# Patient Record
Sex: Male | Born: 1990 | Race: Black or African American | Hispanic: No | Marital: Single | Smoking: Former smoker
Health system: Southern US, Community
[De-identification: ages and names within clinical notes are randomized; demographics above are authoritative.]

---

## 2015-02-09 ENCOUNTER — Emergency Department (HOSPITAL_COMMUNITY)
Admission: EM | Admit: 2015-02-09 | Discharge: 2015-02-09 | Disposition: A | Discharge status: Home / Self Care | Payer: BLUE CROSS/BLUE SHIELD | Attending: Emergency Medicine | Admitting: Emergency Medicine

## 2015-02-09 ENCOUNTER — Encounter (HOSPITAL_COMMUNITY): Payer: Self-pay | Admitting: Nurse Practitioner

## 2015-02-09 DIAGNOSIS — J029 Acute pharyngitis, unspecified: Secondary | ICD-10-CM | POA: Diagnosis not present

## 2015-02-09 DIAGNOSIS — Z720 Tobacco use: Secondary | ICD-10-CM | POA: Diagnosis not present

## 2015-02-09 LAB — RAPID HIV SCREEN (HIV 1/2 AB+AG)
HIV 1/2 ANTIBODIES: NONREACTIVE
HIV-1 P24 Antigen - HIV24: NONREACTIVE

## 2015-02-09 LAB — CBG MONITORING, ED: Glucose-Capillary: 74 mg/dL (ref 70–99)

## 2015-02-09 LAB — RAPID STREP SCREEN (MED CTR MEBANE ONLY): STREPTOCOCCUS, GROUP A SCREEN (DIRECT): NEGATIVE

## 2015-02-09 MED ORDER — CETIRIZINE-PSEUDOEPHEDRINE ER 5-120 MG PO TB12: 1.0000 | ORAL_TABLET | Freq: Every day | ORAL | Status: AC

## 2015-02-09 NOTE — ED Provider Notes (Signed)
CSN: [id number]     Arrival date & time 02/09/15  1555 History  This chart was scribed for non-physician practitioner, Jaynie Crumble, PA-C working with Samuel Jester, DO by Greggory Stallion, ED scribe. This patient was seen in room TR10C/TR10C and the patient's care was started at 4:57 PM.   Chief Complaint  Patient presents with  . Sore Throat   The history is provided by the patient. No language interpreter was used.    HPI Comments: Nathan Daniel is a 24 y.o. male who presents to the Emergency Department complaining of sore throat that started 3 weeks ago. Swallowing worsens pain. Pt reports cold symptoms when the sore throat first started but states they have resolved. He has used Hall's cough drops with little relief. Pt denies fever, rhinorrhea, sneezing, itchy eyes. He smokes cigarettes daily.   History reviewed. No pertinent past medical history. History reviewed. No pertinent past surgical history. History reviewed. No pertinent family history. History  Substance Use Topics  . Smoking status: Current Every Day Smoker    Types: Cigarettes  . Smokeless tobacco: Not on file  . Alcohol Use: Yes    Review of Systems  Constitutional: Negative for fever.  HENT: Positive for sore throat. Negative for rhinorrhea and sneezing.   Eyes: Negative for itching.  All other systems reviewed and are negative.  Allergies  Review of patient's allergies indicates no known allergies.  Home Medications   Prior to Admission medications   Not on File   BP 143/74 mmHg  Pulse 88  Temp(Src) 99.2 F (37.3 C) (Oral)  Resp 20  SpO2 98%   Physical Exam  Constitutional: He is oriented to person, place, and time. He appears well-developed and well-nourished. No distress.  HENT:  Head: Normocephalic and atraumatic.  Right Ear: Tympanic membrane and ear canal normal.  Left Ear: Tympanic membrane and ear canal normal.  Oropharynx erythematous, tonsils are normal size with no enlargement.  Uvula is midline. Tongue with mild white covering.  Eyes: Conjunctivae and EOM are normal.  Neck: Neck supple. No tracheal deviation present.  Cardiovascular: Normal rate.   Pulmonary/Chest: Effort normal. No respiratory distress.  Musculoskeletal: Normal range of motion.  Neurological: He is alert and oriented to person, place, and time.  Skin: Skin is warm and dry.  Psychiatric: He has a normal mood and affect. His behavior is normal.  Nursing note and vitals reviewed.   ED Course  Procedures (including critical care time)  DIAGNOSTIC STUDIES: Oxygen Saturation is 98% on RA, normal by my interpretation.    COORDINATION OF CARE: 5:01 PM-Advised pt of strep results. Discussed treatment plan which includes an allergy medication, ibuprofen and salt water gargles with pt at bedside and pt agreed to plan. Will give pt a referral to [facility] and advised him to follow up.   Labs Review Labs Reviewed  RAPID STREP SCREEN  CULTURE, GROUP A STREP  RAPID HIV SCREEN (HIV 1/2 AB+AG)  CBG MONITORING, ED    Imaging Review No results found.   EKG Interpretation None      MDM   Final diagnoses:  Pharyngitis   Patient with sore throat for several weeks, white coating over the tongue. I do not think this is thrush however patient is concerned. Will get CBG and HIV test. Strep negative. Most likely viral or allergic pharyngitis.    7:15 PM Rapid screen HIV negative. CBG normal. Most likely allergic pharyngitis from post nasal drainage. Will start on sudafed and zyrtec.  Follow up with pcp if not improving.   Filed Vitals:   02/09/15 1558 02/09/15 1907  BP: 143/74 124/71  Pulse: 88 91  Temp: 99.2 F (37.3 C) 99.4 F (37.4 C)  TempSrc: Oral Oral  Resp: 20 14  SpO2: 98% 100%   I personally performed the services described in this documentation, which was scribed in my presence. The recorded information has been reviewed and is accurate.   Jaynie Crumbleatyana Haik Mahoney,  PA-C 02/09/15 1916  Samuel JesterKathleen McManus, DO 02/10/15 0126

## 2015-02-09 NOTE — Discharge Instructions (Signed)
Salt water gargle. Take zyrtec-D twice a day. Please follow up with wellness center if not improving.    Pharyngitis Pharyngitis is redness, pain, and swelling (inflammation) of your pharynx.  CAUSES  Pharyngitis is usually caused by infection. Most of the time, these infections are from viruses (viral) and are part of a cold. However, sometimes pharyngitis is caused by bacteria (bacterial). Pharyngitis can also be caused by allergies. Viral pharyngitis may be spread from person to person by coughing, sneezing, and personal items or utensils (cups, forks, spoons, toothbrushes). Bacterial pharyngitis may be spread from person to person by more intimate contact, such as kissing.  SIGNS AND SYMPTOMS  Symptoms of pharyngitis include:   Sore throat.   Tiredness (fatigue).   Low-grade fever.   Headache.  Joint pain and muscle aches.  Skin rashes.  Swollen lymph nodes.  Plaque-like film on throat or tonsils (often seen with bacterial pharyngitis). DIAGNOSIS  Your health care provider will ask you questions about your illness and your symptoms. Your medical history, along with a physical exam, is often all that is needed to diagnose pharyngitis. Sometimes, a rapid strep test is done. Other lab tests may also be done, depending on the suspected cause.  TREATMENT  Viral pharyngitis will usually get better in 3-4 days without the use of medicine. Bacterial pharyngitis is treated with medicines that kill germs (antibiotics).  HOME CARE INSTRUCTIONS   Drink enough water and fluids to keep your urine clear or pale yellow.   Only take over-the-counter or prescription medicines as directed by your health care provider:   If you are prescribed antibiotics, make sure you finish them even if you start to feel better.   Do not take aspirin.   Get lots of rest.   Gargle with 8 oz of salt water ( tsp of salt per 1 qt of water) as often as every 1-2 hours to soothe your throat.    Throat lozenges (if you are not at risk for choking) or sprays may be used to soothe your throat. SEEK MEDICAL CARE IF:   You have large, tender lumps in your neck.  You have a rash.  You cough up green, yellow-brown, or bloody spit. SEEK IMMEDIATE MEDICAL CARE IF:   Your neck becomes stiff.  You drool or are unable to swallow liquids.  You vomit or are unable to keep medicines or liquids down.  You have severe pain that does not go away with the use of recommended medicines.  You have trouble breathing (not caused by a stuffy nose). MAKE SURE YOU:   Understand these instructions.  Will watch your condition.  Will get help right away if you are not doing well or get worse. Document Released: 11/01/2005 Document Revised: 08/22/2013 Document Reviewed: 07/09/2013 [facility] Patient Information 2015 [place], [place]. This information is not intended to replace advice given to you by your health care provider. Make sure you discuss any questions you have with your health care provider.

## 2015-02-09 NOTE — ED Notes (Signed)
hes had a sore throat "for about 3 weeks since the weather changed." he denies fevers, congestion, ear pain. He states it hurts to swallow

## 2015-02-11 ENCOUNTER — Emergency Department (HOSPITAL_COMMUNITY): Payer: BLUE CROSS/BLUE SHIELD

## 2015-02-11 ENCOUNTER — Emergency Department (HOSPITAL_COMMUNITY)
Admission: EM | Admit: 2015-02-11 | Discharge: 2015-02-12 | Disposition: A | Discharge status: Home / Self Care | Payer: BLUE CROSS/BLUE SHIELD | Attending: Emergency Medicine | Admitting: Emergency Medicine

## 2015-02-11 ENCOUNTER — Encounter (HOSPITAL_COMMUNITY): Payer: Self-pay | Admitting: *Deleted

## 2015-02-11 DIAGNOSIS — Z79899 Other long term (current) drug therapy: Secondary | ICD-10-CM | POA: Insufficient documentation

## 2015-02-11 DIAGNOSIS — M791 Myalgia, unspecified site: Secondary | ICD-10-CM

## 2015-02-11 DIAGNOSIS — R079 Chest pain, unspecified: Secondary | ICD-10-CM | POA: Insufficient documentation

## 2015-02-11 DIAGNOSIS — R059 Cough, unspecified: Secondary | ICD-10-CM

## 2015-02-11 DIAGNOSIS — R110 Nausea: Secondary | ICD-10-CM | POA: Insufficient documentation

## 2015-02-11 DIAGNOSIS — Z720 Tobacco use: Secondary | ICD-10-CM | POA: Diagnosis not present

## 2015-02-11 DIAGNOSIS — R05 Cough: Secondary | ICD-10-CM

## 2015-02-11 DIAGNOSIS — J029 Acute pharyngitis, unspecified: Secondary | ICD-10-CM | POA: Insufficient documentation

## 2015-02-11 DIAGNOSIS — R0789 Other chest pain: Secondary | ICD-10-CM

## 2015-02-11 LAB — I-STAT CHEM 8, ED
BUN: 6 mg/dL (ref 6–23)
CREATININE: 0.9 mg/dL (ref 0.50–1.35)
Calcium, Ion: 1.09 mmol/L — ABNORMAL LOW (ref 1.12–1.23)
Chloride: 97 mmol/L (ref 96–112)
GLUCOSE: 90 mg/dL (ref 70–99)
HCT: 49 % (ref 39.0–52.0)
Hemoglobin: 16.7 g/dL (ref 13.0–17.0)
POTASSIUM: 3.1 mmol/L — AB (ref 3.5–5.1)
SODIUM: 135 mmol/L (ref 135–145)
TCO2: 22 mmol/L (ref 0–100)

## 2015-02-11 LAB — CBC WITH DIFFERENTIAL/PLATELET
Basophils Absolute: 0 10*3/uL (ref 0.0–0.1)
Basophils Relative: 0 % (ref 0–1)
EOS ABS: 0 10*3/uL (ref 0.0–0.7)
Eosinophils Relative: 0 % (ref 0–5)
HEMATOCRIT: 44.7 % (ref 39.0–52.0)
Hemoglobin: 15.4 g/dL (ref 13.0–17.0)
Lymphocytes Relative: 15 % (ref 12–46)
Lymphs Abs: 1.7 10*3/uL (ref 0.7–4.0)
MCH: 29.8 pg (ref 26.0–34.0)
MCHC: 34.5 g/dL (ref 30.0–36.0)
MCV: 86.6 fL (ref 78.0–100.0)
Monocytes Absolute: 1.3 10*3/uL — ABNORMAL HIGH (ref 0.1–1.0)
Monocytes Relative: 11 % (ref 3–12)
Neutro Abs: 8.4 10*3/uL — ABNORMAL HIGH (ref 1.7–7.7)
Neutrophils Relative %: 74 % (ref 43–77)
Platelets: 210 10*3/uL (ref 150–400)
RBC: 5.16 MIL/uL (ref 4.22–5.81)
RDW: 13.4 % (ref 11.5–15.5)
WBC: 11.4 10*3/uL — ABNORMAL HIGH (ref 4.0–10.5)

## 2015-02-11 MED ORDER — DEXAMETHASONE 4 MG PO TABS
6.0000 mg | ORAL_TABLET | Freq: Once | ORAL | Status: AC
  Administered 2015-02-11: 6 mg via ORAL
  Filled 2015-02-11: qty 2

## 2015-02-11 MED ORDER — SODIUM CHLORIDE 0.9 % IV BOLUS (SEPSIS)
1000.0000 mL | Freq: Once | INTRAVENOUS | Status: AC
Start: 2015-02-11 — End: 2015-02-11
  Administered 2015-02-11: 1000 mL via INTRAVENOUS

## 2015-02-11 MED ORDER — ONDANSETRON HCL 4 MG/2ML IJ SOLN
4.0000 mg | Freq: Once | INTRAMUSCULAR | Status: AC
  Administered 2015-02-11: 4 mg via INTRAVENOUS
  Filled 2015-02-11: qty 2

## 2015-02-11 MED ORDER — ACETAMINOPHEN 325 MG PO TABS
650.0000 mg | ORAL_TABLET | Freq: Once | ORAL | Status: AC
  Administered 2015-02-11: 650 mg via ORAL
  Filled 2015-02-11: qty 2

## 2015-02-11 MED ORDER — GI COCKTAIL ~~LOC~~
30.0000 mL | Freq: Once | ORAL | Status: AC
  Administered 2015-02-11: 30 mL via ORAL
  Filled 2015-02-11: qty 30

## 2015-02-11 NOTE — ED Notes (Signed)
The pt ius c/o chest pain mid since this am.  He was seen here Sunday for a sore throat

## 2015-02-11 NOTE — ED Provider Notes (Signed)
CSN: [id number]     Arrival date & time 02/11/15  2033 History   First MD Initiated Contact with Patient 02/11/15 2117     Chief Complaint  Patient presents with  . Chest Pain     (Consider location/radiation/quality/duration/timing/severity/associated sxs/prior Treatment) HPI Comments: Asian seen 2 days ago for sore throat.  He was tested negative for strep.  Was discharged him.  He continues to have sore throat.  Now he is having difficulty swallowing, nausea, if he eats solids, but is able to take small amounts of liquids frequently.  He is now reporting epigastric pain that radiates to his back that is worse with food attempt.  Denies any diarrhea or vomiting, fever  Has not taken for his symptoms  Patient is a 24 y.o. male presenting with chest pain. The history is provided by the patient.  Chest Pain Pain location:  Epigastric Pain quality: sharp   Pain radiates to:  Upper back Pain radiates to the back: yes   Pain severity:  Mild Onset quality:  Unable to specify Duration:  2 days Timing:  Intermittent Progression:  Worsening Chronicity:  New Context: eating   Relieved by:  Nothing Worsened by:  Nothing tried Associated symptoms: abdominal pain, dysphagia and nausea   Associated symptoms: no cough, no dizziness, no fever, no headache and not vomiting     History reviewed. No pertinent past medical history. History reviewed. No pertinent past surgical history. No family history on file. History  Substance Use Topics  . Smoking status: Current Every Day Smoker    Types: Cigarettes  . Smokeless tobacco: Not on file  . Alcohol Use: Yes    Review of Systems  Constitutional: Negative for fever.  HENT: Positive for sore throat and trouble swallowing.   Respiratory: Negative for cough.   Cardiovascular: Positive for chest pain.  Gastrointestinal: Positive for nausea and abdominal pain. Negative for vomiting and diarrhea.  Genitourinary: Negative for dysuria and flank  pain.  Neurological: Negative for dizziness and headaches.  All other systems reviewed and are negative.     Allergies  Review of patient's allergies indicates no known allergies.  Home Medications   Prior to Admission medications   Medication Sig Start Date End Date Taking? Authorizing Provider  cetirizine-pseudoephedrine (ZYRTEC-D) 5-120 MG per tablet Take 1 tablet by mouth daily. 02/09/15  Yes Tatyana Kirichenko, PA-C   BP 103/58 mmHg  Pulse 87  Temp(Src) 102.8 F (39.3 C)  Resp 16  Ht 6' (1.829 m)  Wt 239 lb (108.41 kg)  BMI 32.41 kg/m2  SpO2 98% Physical Exam  Constitutional: He is oriented to person, place, and time. He appears well-developed and well-nourished.  HENT:  Head: Normocephalic.  Eyes: Pupils are equal, round, and reactive to light.  Neck: Normal range of motion.  Cardiovascular: Normal rate and regular rhythm.   Pulmonary/Chest: Effort normal and breath sounds normal.  Abdominal: Soft. Bowel sounds are normal. He exhibits no distension. There is no tenderness. There is no rebound and no guarding.  Musculoskeletal: Normal range of motion.  Neurological: He is alert and oriented to person, place, and time.  Skin: Skin is warm and dry. No rash noted. No erythema.  Nursing note and vitals reviewed.   ED Course  Procedures (including critical care time) Labs Review Labs Reviewed  CBC WITH DIFFERENTIAL/PLATELET - Abnormal; Notable for the following:    WBC 11.4 (*)    Neutro Abs 8.4 (*)    Monocytes Absolute 1.3 (*)  All other components within normal limits  I-STAT CHEM 8, ED - Abnormal; Notable for the following:    Potassium 3.1 (*)    Calcium, Ion 1.09 (*)    All other components within normal limits    Imaging Review Dg Chest 2 View  02/11/2015   CLINICAL DATA:  Chest pain  EXAM: CHEST  2 VIEW  COMPARISON:  None.  FINDINGS: The heart size and mediastinal contours are within normal limits. Both lungs are clear. The visualized skeletal  structures are unremarkable.  IMPRESSION: No active cardiopulmonary disease.   Electronically Signed   By: Natasha MeadLiviu  Pop M.D.   On: 02/11/2015 21:19     EKG Interpretation   Date/Time:  Tuesday February 11 2015 20:42:02 EDT Ventricular Rate:  101 PR Interval:  156 QRS Duration: 92 QT Interval:  320 QTC Calculation: 414 R Axis:   104 Text Interpretation:  Sinus tachycardia Rightward axis Borderline ECG No  old tracing to compare Confirmed by Ethelda ChickJACUBOWITZ  MD, SAM [phone]([id number]) on  02/11/2015 8:45:39 PM      MDM   Final diagnoses:  Pharyngitis  Chest pain of unknown etiology  Cough  Myalgia         Earley FavorGail Malaka Ruffner, NP 02/12/15 0050  Linwood DibblesJon Knapp, MD 02/12/15 [phone]

## 2015-02-12 LAB — CULTURE, GROUP A STREP: Strep A Culture: NEGATIVE

## 2015-02-12 MED ORDER — KETOROLAC TROMETHAMINE 30 MG/ML IJ SOLN
30.0000 mg | Freq: Once | INTRAMUSCULAR | Status: AC
  Administered 2015-02-12: 30 mg via INTRAVENOUS
  Filled 2015-02-12: qty 1

## 2015-02-12 NOTE — Discharge Instructions (Signed)
Chest Pain (Nonspecific) It is often hard to give a diagnosis for the cause of chest pain. There is always a chance that your pain could be related to something serious, such as a heart attack or a blood clot in the lungs. You need to follow up with your doctor. HOME CARE  If antibiotic medicine was given, take it as directed by your doctor. Finish the medicine even if you start to feel better.  For the next few days, avoid activities that bring on chest pain. Continue physical activities as told by your doctor.  Do not use any tobacco products. This includes cigarettes, chewing tobacco, and e-cigarettes.  Avoid drinking alcohol.  Only take medicine as told by your doctor.  Follow your doctor's suggestions for more testing if your chest pain does not go away.  Keep all doctor visits you made. GET HELP IF:  Your chest pain does not go away, even after treatment.  You have a rash with blisters on your chest.  You have a fever. GET HELP RIGHT AWAY IF:   You have more pain or pain that spreads to your arm, neck, jaw, back, or belly (abdomen).  You have shortness of breath.  You cough more than usual or cough up blood.  You have very bad back or belly pain.  You feel sick to your stomach (nauseous) or throw up (vomit).  You have very bad weakness.  You pass out (faint).  You have chills. This is an emergency. Do not wait to see if the problems will go away. Call your local emergency services (911 in [place] Do not drive yourself to the hospital. MAKE SURE YOU:   Understand these instructions.  Will watch your condition.  Will get help right away if you are not doing well or get worse. Document Released: 04/19/2008 Document Revised: 11/06/2013 Document Reviewed: 04/19/2008 [facility]Exit[facility] Patient Information 2015 [place] ParkExitCare, [place]LL[place]. This information is not intended to replace advice given to you by your health care provider. Make sure you discuss any questions you have with your  health care provider.  Your labwork is within normal parameters.  Your chest x-ray is normal as well as your EKG you have been given IV fluids, pain medication, you were given a long-acting spelled pertinent.  Steroid for the swelling in your throat.  He should not need any further medication for that she can take over-the-counter ibuprofen or Tylenol for fevers and body aches

## 2015-02-12 NOTE — ED Notes (Signed)
Pt. Left with all belongings and refused wheelchair 

## 2015-08-21 ENCOUNTER — Encounter (HOSPITAL_COMMUNITY): Payer: Self-pay | Admitting: Emergency Medicine

## 2015-08-21 ENCOUNTER — Emergency Department (HOSPITAL_COMMUNITY)
Admission: EM | Admit: 2015-08-21 | Discharge: 2015-08-21 | Disposition: A | Discharge status: Home / Self Care | Payer: BLUE CROSS/BLUE SHIELD | Attending: Emergency Medicine | Admitting: Emergency Medicine

## 2015-08-21 DIAGNOSIS — L02211 Cutaneous abscess of abdominal wall: Secondary | ICD-10-CM | POA: Insufficient documentation

## 2015-08-21 DIAGNOSIS — B86 Scabies: Secondary | ICD-10-CM | POA: Insufficient documentation

## 2015-08-21 DIAGNOSIS — L0291 Cutaneous abscess, unspecified: Secondary | ICD-10-CM

## 2015-08-21 DIAGNOSIS — Z87891 Personal history of nicotine dependence: Secondary | ICD-10-CM | POA: Insufficient documentation

## 2015-08-21 DIAGNOSIS — Z79899 Other long term (current) drug therapy: Secondary | ICD-10-CM | POA: Insufficient documentation

## 2015-08-21 MED ORDER — PERMETHRIN 5 % EX CREA: TOPICAL_CREAM | CUTANEOUS | Status: AC

## 2015-08-21 MED ORDER — SULFAMETHOXAZOLE-TRIMETHOPRIM 800-160 MG PO TABS: 1.0000 | ORAL_TABLET | Freq: Two times a day (BID) | ORAL | Status: AC

## 2015-08-21 NOTE — ED Provider Notes (Signed)
CSN: [id number]     Arrival date & time 08/21/15  1118 History   By signing my name below, I, Jarvis Morgan, attest that this documentation has been prepared under the direction and in the presence of No att. providers found. Electronically Signed: Jarvis Morgan, ED Scribe. 08/21/2015. 3:23 PM.    Chief Complaint  Patient presents with  . Insect Bite   The history is provided by the patient. No language interpreter was used.    HPI Comments: Nathan Daniel is a 24 y.o. male who presents to the Emergency Department complaining of itchy, red, rash located in the web space between his fingers onset 3-4 days. Pt also has an associated red, open, dry, raised skin eruption to his abdomen that is itchy. He states 1 of his roommates has similar symptoms. He has not taking any meds PTA. He denies any living or sleeping areas. Pt denies any other complaints at this time.     History reviewed. No pertinent past medical history. History reviewed. No pertinent past surgical history. History reviewed. No pertinent family history. Social History  Substance Use Topics  . Smoking status: Former Smoker    Types: Cigarettes  . Smokeless tobacco: None  . Alcohol Use: Yes    Review of Systems    Allergies  Review of patient's allergies indicates no known allergies.  Home Medications   Prior to Admission medications   Medication Sig Start Date End Date Taking? Authorizing Provider  cetirizine-pseudoephedrine (ZYRTEC-D) 5-120 MG per tablet Take 1 tablet by mouth daily. 02/09/15   Tatyana Kirichenko, PA-C  permethrin (ELIMITE) 5 % cream Apply to affected area once in the evening.  Wash off in the morning. Can reapply in one week. 08/21/15   Donnetta Hutching, MD  sulfamethoxazole-trimethoprim (BACTRIM DS,SEPTRA DS) 800-160 MG tablet Take 1 tablet by mouth 2 (two) times daily. 08/21/15 08/28/15  Donnetta Hutching, MD   Triage Vitals: BP 126/75 mmHg  Pulse 68  Temp(Src) 99.2 F (37.3 C) (Oral)  Resp 16  SpO2  98%  Physical Exam  Constitutional: He is oriented to person, place, and time. He appears well-developed and well-nourished.  HENT:  Head: Normocephalic and atraumatic.  Eyes: Conjunctivae and EOM are normal. Pupils are equal, round, and reactive to light.  Neck: Normal range of motion. Neck supple.  Pulmonary/Chest: Breath sounds normal.  Abdominal: Soft.  Musculoskeletal: Normal range of motion.  Neurological: He is alert and oriented to person, place, and time.  Skin: Skin is warm and dry.  left central lower abdomen area of induration about 2 cm in diameter and central 3mm ulceration Punctate papules in web space between his digits  Psychiatric: He has a normal mood and affect. His behavior is normal.  Nursing note and vitals reviewed.   ED Course  Procedures (including critical care time)  DIAGNOSTIC STUDIES: Oxygen Saturation is 98% on RA, normal by my interpretation.    COORDINATION OF CARE:  12:00 PM- Will order rx for Septra DS along with Permethrin. - Pt advised of plan for treatment and pt agrees.    Labs Review Labs Reviewed - No data to display   Imaging Review No results found.    EKG Interpretation None      MDM   Final diagnoses:  Abscess  Scabies   Patient is a minor abscess on his abdomen and I suspect scabies on his hand. Rx Septra DS and permethrin cream to hands. Discussed diagnoses with patient.  I, Manoah Deckard, personally performed the services described  in this documentation. All medical record entries made by the scribe were at my direction and in my presence.  I have reviewed the chart and discharge instructions and agree that the record reflects my personal performance and is accurate and complete. Ashlyn Cabler.  08/21/2015. 3:23 PM.       Donnetta Hutching, MD 08/21/15 1524

## 2015-08-21 NOTE — ED Notes (Signed)
Pt reports wounds to abdomen 3-4 days. No drainage, denies fever. States has been itching wounds. Also c/o scratching between fingers. Pt awake, alert, oriented x4, NAD at present.

## 2015-08-23 ENCOUNTER — Encounter (HOSPITAL_BASED_OUTPATIENT_CLINIC_OR_DEPARTMENT_OTHER): Payer: Self-pay | Admitting: Emergency Medicine

## 2015-08-23 ENCOUNTER — Emergency Department (HOSPITAL_BASED_OUTPATIENT_CLINIC_OR_DEPARTMENT_OTHER)
Admission: EM | Admit: 2015-08-23 | Discharge: 2015-08-23 | Disposition: A | Discharge status: Home / Self Care | Payer: BLUE CROSS/BLUE SHIELD | Attending: Emergency Medicine | Admitting: Emergency Medicine

## 2015-08-23 DIAGNOSIS — Z4800 Encounter for change or removal of nonsurgical wound dressing: Secondary | ICD-10-CM | POA: Diagnosis present

## 2015-08-23 DIAGNOSIS — L02211 Cutaneous abscess of abdominal wall: Secondary | ICD-10-CM | POA: Diagnosis not present

## 2015-08-23 DIAGNOSIS — L0291 Cutaneous abscess, unspecified: Secondary | ICD-10-CM

## 2015-08-23 DIAGNOSIS — Z79899 Other long term (current) drug therapy: Secondary | ICD-10-CM | POA: Insufficient documentation

## 2015-08-23 DIAGNOSIS — Z87891 Personal history of nicotine dependence: Secondary | ICD-10-CM | POA: Diagnosis not present

## 2015-08-23 NOTE — ED Provider Notes (Signed)
CSN: [id number]     Arrival date & time 08/23/15  2156 History  By signing my name below, I, Budd Palmer, attest that this documentation has been prepared under the direction and in the presence of Rolland Porter, MD. Electronically Signed: Budd Palmer, ED Scribe. 08/23/2015. 10:19 PM.    Chief Complaint  Patient presents with  . Wound Check   The history is provided by the patient. No language interpreter was used.   HPI Comments: Nathan Daniel is a 24 y.o. male who presents to the Emergency Department for a wound check of an abscess to the left central lower abdomen that was treated in the ED 2 days ago. Pt states that an I&D was not performed at that time. He notes he was also given bactrim which he has been taking as prescribed. He reports the abscess has been draining on its own.  History reviewed. No pertinent past medical history. History reviewed. No pertinent past surgical history. History reviewed. No pertinent family history. Social History  Substance Use Topics  . Smoking status: Former Smoker    Types: Cigarettes  . Smokeless tobacco: None  . Alcohol Use: Yes    Review of Systems  Constitutional: Negative for fever, chills, diaphoresis, appetite change and fatigue.  HENT: Negative for mouth sores, sore throat and trouble swallowing.   Eyes: Negative for visual disturbance.  Respiratory: Negative for cough, chest tightness, shortness of breath and wheezing.   Cardiovascular: Negative for chest pain.  Gastrointestinal: Negative for nausea, vomiting, abdominal pain, diarrhea and abdominal distention.  Endocrine: Negative for polydipsia, polyphagia and polyuria.  Genitourinary: Negative for dysuria, frequency and hematuria.  Musculoskeletal: Negative for gait problem.  Skin: Positive for wound. Negative for color change, pallor and rash.  Neurological: Negative for dizziness, syncope, light-headedness and headaches.  Hematological: Does not bruise/bleed easily.   Psychiatric/Behavioral: Negative for behavioral problems and confusion.    Allergies  Review of patient's allergies indicates no known allergies.  Home Medications   Prior to Admission medications   Medication Sig Start Date End Date Taking? Authorizing Provider  cetirizine-pseudoephedrine (ZYRTEC-D) 5-120 MG per tablet Take 1 tablet by mouth daily. 02/09/15   Tatyana Kirichenko, PA-C  permethrin (ELIMITE) 5 % cream Apply to affected area once in the evening.  Wash off in the morning. Can reapply in one week. 08/21/15   Donnetta Hutching, MD  sulfamethoxazole-trimethoprim (BACTRIM DS,SEPTRA DS) 800-160 MG tablet Take 1 tablet by mouth 2 (two) times daily. 08/21/15 08/28/15  Donnetta Hutching, MD   BP 129/68 mmHg  Pulse 60  Temp(Src) 98.4 F (36.9 C) (Oral)  Resp 16  Ht 6' (1.829 m)  Wt 215 lb (97.523 kg)  BMI 29.15 kg/m2  SpO2 100% Physical Exam  Constitutional: He is oriented to person, place, and time. He appears well-developed and well-nourished. No distress.  HENT:  Head: Normocephalic.  Eyes: Conjunctivae are normal. Pupils are equal, round, and reactive to light. No scleral icterus.  Neck: Normal range of motion. Neck supple. No thyromegaly present.  Cardiovascular: Normal rate and regular rhythm.  Exam reveals no gallop and no friction rub.   No murmur heard. Pulmonary/Chest: Effort normal and breath sounds normal. No respiratory distress. He has no wheezes. He has no rales.  Abdominal: Soft. Bowel sounds are normal. He exhibits no distension. There is no tenderness. There is no rebound.  Left lower abdominal wall has a 1 cm draining wound with minimal surrounding induration and no fluctuance  Musculoskeletal: Normal range of motion.  Neurological:  He is alert and oriented to person, place, and time.  Skin: Skin is warm and dry. No rash noted.  Psychiatric: He has a normal mood and affect. His behavior is normal.    ED Course  Procedures  DIAGNOSTIC STUDIES: Oxygen Saturation is  100% on RA, normal by my interpretation.    COORDINATION OF CARE: 10:15 PM - Discussed plans to discharge. Advised to continue taking his prescribed medication. Pt advised of plan for treatment and pt agrees.  Labs Review Labs Reviewed - No data to display  Imaging Review No results found. I have personally reviewed and evaluated these images and lab results as part of my medical decision-making.   EKG Interpretation None      MDM   Final diagnoses:  Abscess   I personally performed the services described in this documentation, which was scribed in my presence. The recorded information has been reviewed and is accurate.   Rolland Porter, MD 08/23/15 2224

## 2015-08-23 NOTE — ED Notes (Signed)
Patient was seen at [facility] on Thursday for an abcess to his left lower abdominal area. The "stuffing" fell out and "now I have a hole so I wanted to get it checked again"

## 2015-08-23 NOTE — Discharge Instructions (Signed)
Begin soaks twice per day with gentle massage to promote drainage. Continue your antibiotics as prescribed.   Abscess An abscess is an infected area that contains a collection of pus and debris.It can occur in almost any part of the body. An abscess is also known as a furuncle or boil. CAUSES  An abscess occurs when tissue gets infected. This can occur from blockage of oil or sweat glands, infection of hair follicles, or a minor injury to the skin. As the body tries to fight the infection, pus collects in the area and creates pressure under the skin. This pressure causes pain. People with weakened immune systems have difficulty fighting infections and get certain abscesses more often.  SYMPTOMS Usually an abscess develops on the skin and becomes a painful mass that is red, warm, and tender. If the abscess forms under the skin, you may feel a moveable soft area under the skin. Some abscesses break open (rupture) on their own, but most will continue to get worse without care. The infection can spread deeper into the body and eventually into the bloodstream, causing you to feel ill.  DIAGNOSIS  Your caregiver will take your medical history and perform a physical exam. A sample of fluid may also be taken from the abscess to determine what is causing your infection. TREATMENT  Your caregiver may prescribe antibiotic medicines to fight the infection. However, taking antibiotics alone usually does not cure an abscess. Your caregiver may need to make a small cut (incision) in the abscess to drain the pus. In some cases, gauze is packed into the abscess to reduce pain and to continue draining the area. HOME CARE INSTRUCTIONS   Only take over-the-counter or prescription medicines for pain, discomfort, or fever as directed by your caregiver.  If you were prescribed antibiotics, take them as directed. Finish them even if you start to feel better.  If gauze is used, follow your caregiver's directions for  changing the gauze.  To avoid spreading the infection:  Keep your draining abscess covered with a bandage.  Wash your hands well.  Do not share personal care items, towels, or whirlpools with others.  Avoid skin contact with others.  Keep your skin and clothes clean around the abscess.  Keep all follow-up appointments as directed by your caregiver. SEEK MEDICAL CARE IF:   You have increased pain, swelling, redness, fluid drainage, or bleeding.  You have muscle aches, chills, or a general ill feeling.  You have a fever. MAKE SURE YOU:   Understand these instructions.  Will watch your condition.  Will get help right away if you are not doing well or get worse.   This information is not intended to replace advice given to you by your health care provider. Make sure you discuss any questions you have with your health care provider.   Document Released: 08/11/2005 Document Revised: 05/02/2012 Document Reviewed: 01/14/2012 [employer] Interactive Patient Education [employer]

## 2015-09-15 IMAGING — CR DG CHEST 2V
2 series · 2 of 2 positions shown · non-contrast
Comparison: None.

CLINICAL DATA: Chest pain

EXAM:
CHEST  2 VIEW

[chest pa]
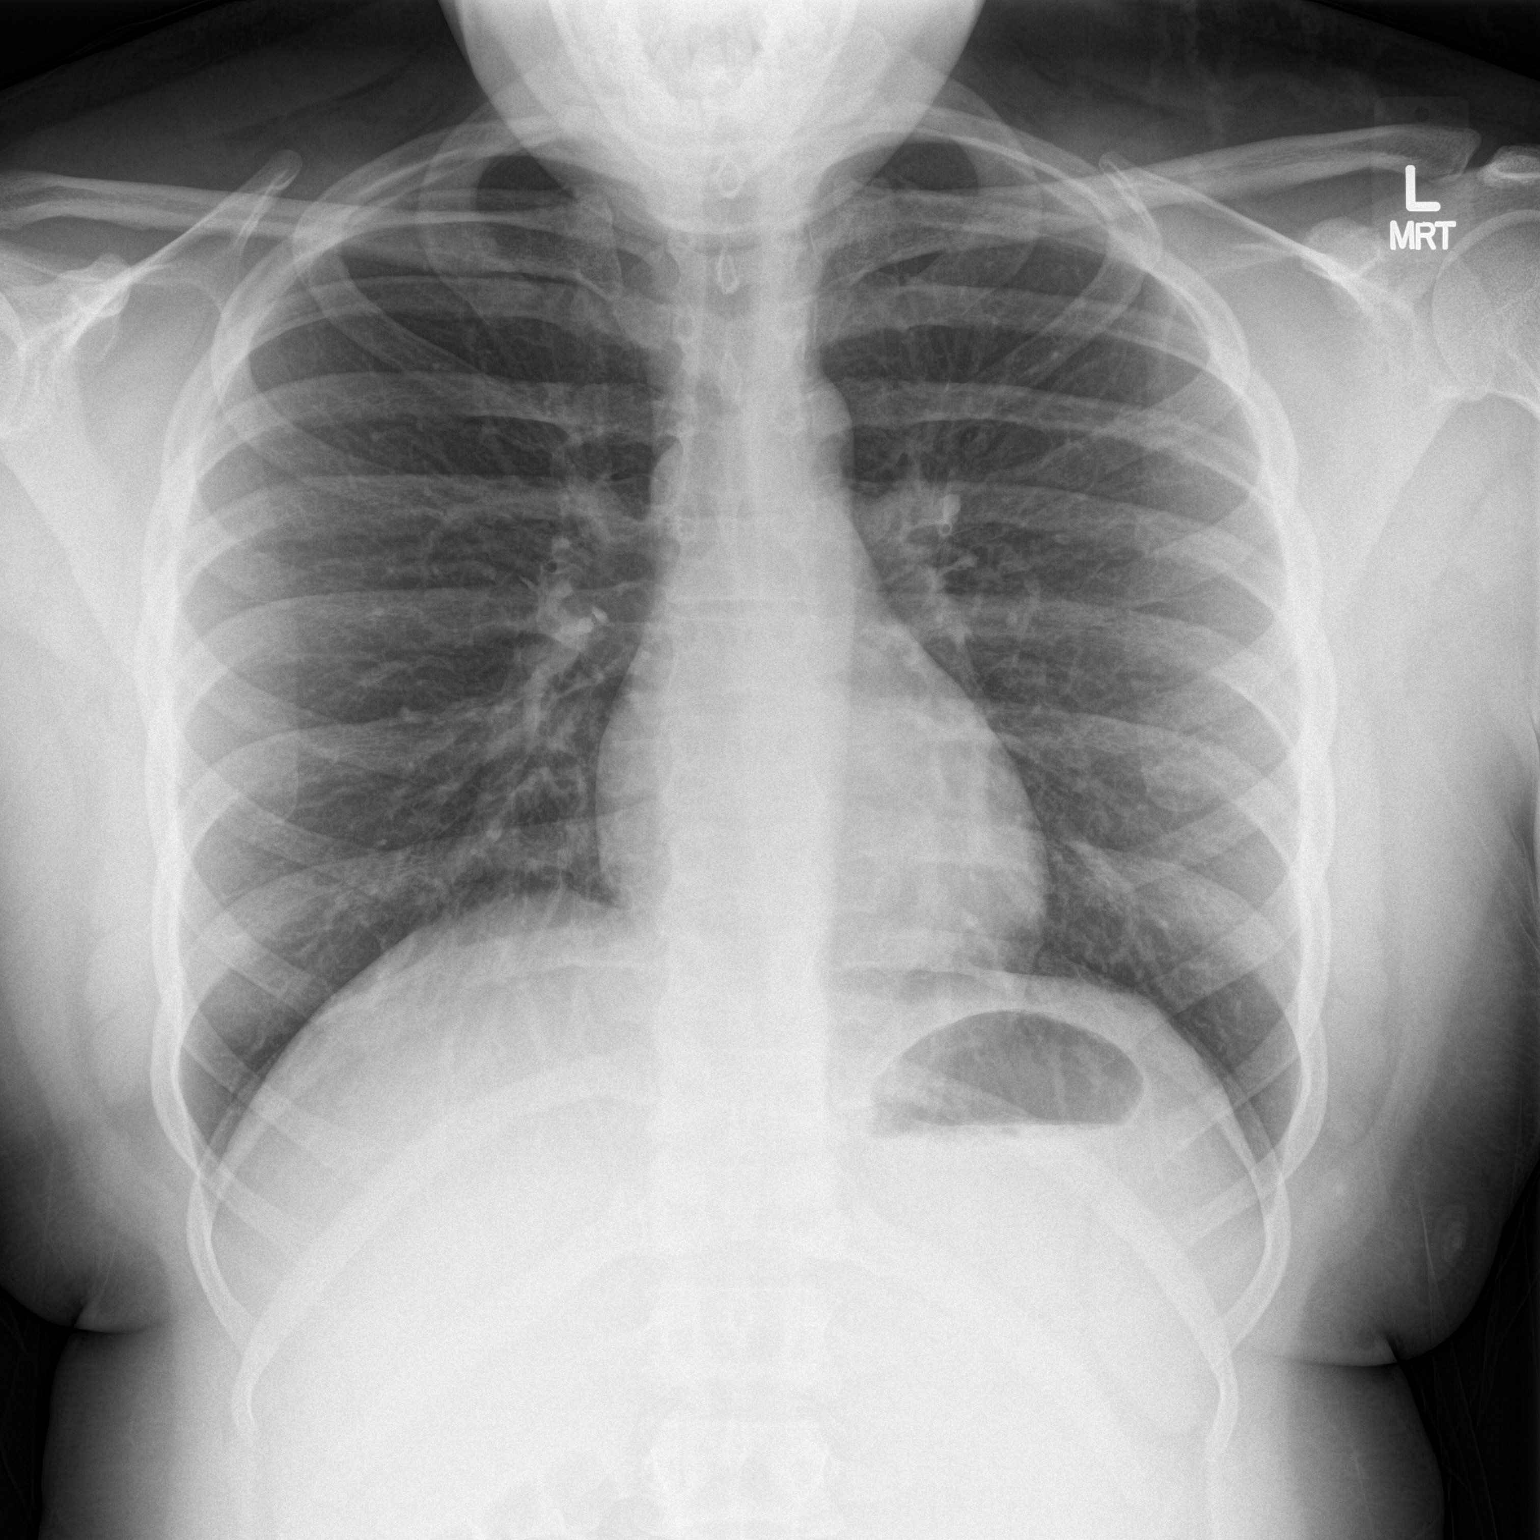

[chest lat]
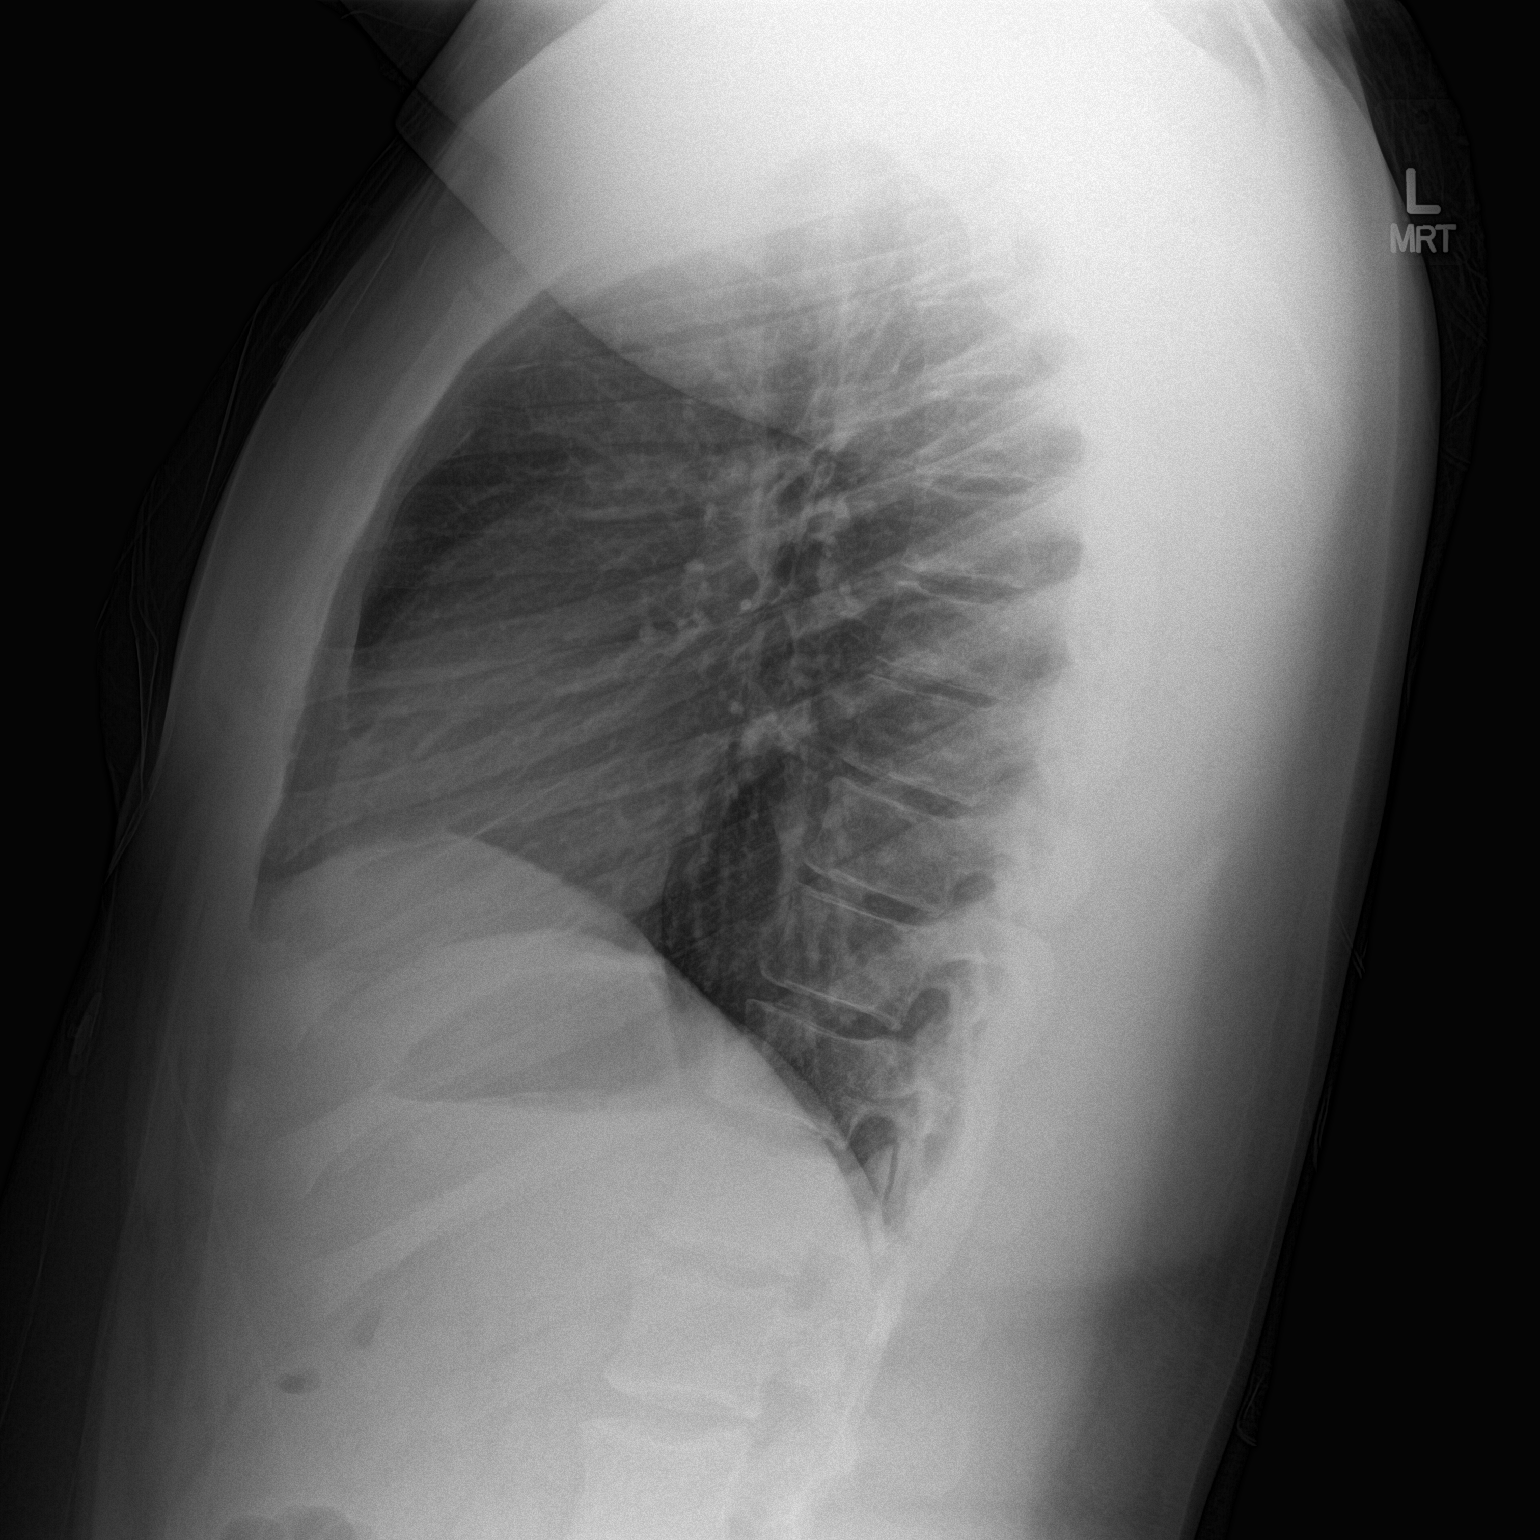

[2 of 2 positions shown; findings below may reference images not displayed]

FINDINGS: The heart size and mediastinal contours are within normal limits.
Both lungs are clear. The visualized skeletal structures are
unremarkable.
IMPRESSION: No active cardiopulmonary disease.
